# Patient Record
Sex: Female | Born: 1990 | Race: White | Hispanic: No | Marital: Single | Smoking: Former smoker
Health system: Southern US, Community
[De-identification: ages and names within clinical notes are randomized; demographics above are authoritative.]

## PROBLEM LIST (undated history)

## (undated) DIAGNOSIS — B2799 Infectious mononucleosis, unspecified with other complication: Secondary | ICD-10-CM

## (undated) DIAGNOSIS — J302 Other seasonal allergic rhinitis: Secondary | ICD-10-CM

## (undated) DIAGNOSIS — B178 Other specified acute viral hepatitis: Secondary | ICD-10-CM

## (undated) HISTORY — PX: WISDOM TOOTH EXTRACTION: SHX21

## (undated) HISTORY — DX: Other specified acute viral hepatitis: B17.8

## (undated) HISTORY — DX: Infectious mononucleosis, unspecified with other complication: B27.99

## (undated) HISTORY — DX: Other seasonal allergic rhinitis: J30.2

---

## 2012-02-24 DIAGNOSIS — B178 Other specified acute viral hepatitis: Secondary | ICD-10-CM

## 2012-02-24 HISTORY — DX: Other specified acute viral hepatitis: B17.8

## 2012-03-08 ENCOUNTER — Ambulatory Visit (INDEPENDENT_AMBULATORY_CARE_PROVIDER_SITE_OTHER): Payer: BC Managed Care – HMO | Admitting: Physician Assistant

## 2012-03-08 VITALS — BP 105/69 | HR 59 | Temp 97.9°F | Resp 16 | Ht 62.0 in | Wt 124.0 lb

## 2012-03-08 DIAGNOSIS — J069 Acute upper respiratory infection, unspecified: Secondary | ICD-10-CM

## 2012-03-08 MED ORDER — ALBUTEROL SULFATE HFA 108 (90 BASE) MCG/ACT IN AERS: 2.0000 | INHALATION_SPRAY | Freq: Four times a day (QID) | RESPIRATORY_TRACT | Status: DC | PRN

## 2012-03-08 MED ORDER — AZITHROMYCIN 250 MG PO TABS: ORAL_TABLET | ORAL | Status: DC

## 2012-03-08 MED ORDER — TOBRAMYCIN 0.3 % OP SOLN: 1.0000 [drp] | OPHTHALMIC | Status: DC

## 2012-03-08 NOTE — Patient Instructions (Addendum)
mucinex dm and sudafed.

## 2012-03-08 NOTE — Progress Notes (Signed)
  Subjective:    Patient ID: Kara Parker, female    DOB: 09/10/1990, 21 y.o.   MRN: [id number]  HPI 21 yr old CF presents with 1 week h/o cough, slight sore throat, R eye feeling full and irritated.  It is a little red.  No f/c.  Cough is worsening. OTCs not helping. Eye is not painful.   Review of Systems  All other systems reviewed and are negative.       Objective:   Physical Exam  Nursing note and vitals reviewed. Constitutional: She is oriented to person, place, and time. She appears well-developed and well-nourished.  HENT:  Head: Normocephalic and atraumatic.  Mouth/Throat: No oropharyngeal exudate.       B TM bulging with fluid.  Nasal turbinates inflamed. R side of throat with>erythema on the R than Left.  +preauricular node on R.  Eyes: EOM are normal. Pupils are equal, round, and reactive to light. Right eye exhibits no discharge. Left eye exhibits no discharge.       R conjunctiva is mildly injected and erythematous.  No active draining or chemosis.   Neck: Normal range of motion. Neck supple.  Cardiovascular: Normal rate, regular rhythm and normal heart sounds.   Pulmonary/Chest: Effort normal and breath sounds normal.       Rhonchi B on expiration at B bases and mild wheezing.  Neurological: She is alert and oriented to person, place, and time.  Skin: Skin is warm and dry.  Psychiatric: She has a normal mood and affect. Her behavior is normal.        Assessment & Plan:  URI, likely adenovirus, but with secondary bronchitis.  Hold eye drops-she will only use if she develops a true bacterial pink eye. Fluids, rest, respiratory care h/o

## 2012-03-14 ENCOUNTER — Emergency Department (HOSPITAL_COMMUNITY)
Admission: EM | Admit: 2012-03-14 | Discharge: 2012-03-15 | Disposition: A | Discharge status: Home / Self Care | Payer: BC Managed Care – PPO | Attending: Emergency Medicine | Admitting: Emergency Medicine

## 2012-03-14 DIAGNOSIS — R599 Enlarged lymph nodes, unspecified: Secondary | ICD-10-CM | POA: Insufficient documentation

## 2012-03-14 DIAGNOSIS — R591 Generalized enlarged lymph nodes: Secondary | ICD-10-CM

## 2012-03-14 DIAGNOSIS — Z880 Allergy status to penicillin: Secondary | ICD-10-CM | POA: Insufficient documentation

## 2012-03-14 DIAGNOSIS — F17200 Nicotine dependence, unspecified, uncomplicated: Secondary | ICD-10-CM | POA: Insufficient documentation

## 2012-03-14 NOTE — ED Notes (Signed)
Pt states she has been sick off and on for 4 weeks. Pt just finished abx and now she states her lymph nodes in her neck and groin are swollen. Pt c/o tightness to neck and back. Pt denies cough, sore throat at present. Pt states she has had "night sweats" last night. Pt also c/o headache.

## 2012-03-15 LAB — CBC WITH DIFFERENTIAL/PLATELET
Eosinophils Relative: 0 % (ref 0–5)
HCT: 38 % (ref 36.0–46.0)
Hemoglobin: 12.8 g/dL (ref 12.0–15.0)
Lymphocytes Relative: 20 % (ref 12–46)
Lymphs Abs: 1 10*3/uL (ref 0.7–4.0)
MCH: 30.8 pg (ref 26.0–34.0)
MCV: 91.6 fL (ref 78.0–100.0)
Monocytes Absolute: 0.3 10*3/uL (ref 0.1–1.0)
Monocytes Relative: 7 % (ref 3–12)
Platelets: 183 10*3/uL (ref 150–400)
RBC: 4.15 MIL/uL (ref 3.87–5.11)
WBC: 5 10*3/uL (ref 4.0–10.5)

## 2012-03-15 LAB — URINALYSIS, ROUTINE W REFLEX MICROSCOPIC
Glucose, UA: NEGATIVE mg/dL
Hgb urine dipstick: NEGATIVE
Ketones, ur: NEGATIVE mg/dL
Leukocytes, UA: NEGATIVE
pH: 6 (ref 5.0–8.0)

## 2012-03-15 NOTE — ED Provider Notes (Signed)
History     CSN: [id number]  Arrival date & time 03/14/12  2254   First MD Initiated Contact with Patient 03/14/12 2358      Chief Complaint  Patient presents with  . Generalized Body Aches    (Consider location/radiation/quality/duration/timing/severity/associated sxs/prior treatment) HPI Comments: 72 [address] 21 y.o. female   The chief complaint is: Patient presents with:   Generalized Body Aches   The patient has medical history significant for:   No past medical history on file.  Patient presents with complaint of generalized body aches and swollen glands. Patient states that she was recently sick for two weeks, was prescribed a z-pak but then noticed a swollen lymph node on the left side of her neck while curling her hair. Denies fever or unintentional weightloss but reports night sweats. Denies cough or SOB. Denies NVD or abdominal pain. Denies sick contacts.      The history is provided by the patient.    No past medical history on file.  No past surgical history on file.  No family history on file.  History  Substance Use Topics  . Smoking status: Current Some Day Smoker    Types: Cigarettes  . Smokeless tobacco: Not on file  . Alcohol Use: Not on file    OB History    Grav Para Term Preterm Abortions TAB SAB Ect Mult Living                  Review of Systems  Constitutional: Positive for diaphoresis. Negative for fever and chills.  HENT: Negative for congestion and sore throat.   Respiratory: Negative for cough.   Gastrointestinal: Negative for nausea, vomiting, abdominal pain and diarrhea.  Hematological: Positive for adenopathy.    Allergies  Penicillins  Home Medications  No current outpatient prescriptions on file.  BP 114/75  Pulse 88  Temp 99.2 F (37.3 C) (Oral)  Resp 16  SpO2 100%  LMP 03/02/2012  Physical Exam  Nursing note and vitals reviewed. Constitutional: She appears well-developed and well-nourished. No distress.    HENT:  Head: Normocephalic and atraumatic.  Mouth/Throat: Oropharynx is clear and moist. No oropharyngeal exudate.  Eyes: Conjunctivae normal and EOM are normal. No scleral icterus.  Neck: Normal range of motion. Neck supple.       There is superficial cervical and posterior cervical non-tender lymphadenopathy.  No supraclavicular adenopathy appreciated.  Cardiovascular: Normal rate, regular rhythm and normal heart sounds.   Pulmonary/Chest: Effort normal.  Abdominal: Soft. Bowel sounds are normal. There is no tenderness.  Genitourinary:       Patient has palpable non-tender inguinal lymphadenopathy.  Musculoskeletal:       No epitrochlear lymphadenopathy appreciated  Lymphadenopathy:    She has cervical adenopathy.  Neurological: She is alert.  Skin: Skin is warm and dry.    ED Course  Procedures (including critical care time)  Labs Reviewed - No data to display No results found. Results for orders placed during the hospital encounter of 03/14/12  CBC WITH DIFFERENTIAL      Component Value Range   WBC 5.0  4.0 - 10.5 K/uL   RBC 4.15  3.87 - 5.11 MIL/uL   Hemoglobin 12.8  12.0 - 15.0 g/dL   HCT 40.9  81.1 - 91.4 %   MCV 91.6  78.0 - 100.0 fL   MCH 30.8  26.0 - 34.0 pg   MCHC 33.7  30.0 - 36.0 g/dL   RDW 78.2  95.6 - 21.3 %  Platelets 183  150 - 400 K/uL   Neutrophils Relative 73  43 - 77 %   Neutro Abs 3.7  1.7 - 7.7 K/uL   Lymphocytes Relative 20  12 - 46 %   Lymphs Abs 1.0  0.7 - 4.0 K/uL   Monocytes Relative 7  3 - 12 %   Monocytes Absolute 0.3  0.1 - 1.0 K/uL   Eosinophils Relative 0  0 - 5 %   Eosinophils Absolute 0.0  0.0 - 0.7 K/uL   Basophils Relative 1  0 - 1 %   Basophils Absolute 0.1  0.0 - 0.1 K/uL  URINALYSIS, ROUTINE W REFLEX MICROSCOPIC      Component Value Range   Color, Urine YELLOW  YELLOW   APPearance CLEAR  CLEAR   Specific Gravity, Urine 1.015  1.005 - 1.030   pH 6.0  5.0 - 8.0   Glucose, UA NEGATIVE  NEGATIVE mg/dL   Hgb urine dipstick  NEGATIVE  NEGATIVE   Bilirubin Urine NEGATIVE  NEGATIVE   Ketones, ur NEGATIVE  NEGATIVE mg/dL   Protein, ur NEGATIVE  NEGATIVE mg/dL   Urobilinogen, UA 0.2  0.0 - 1.0 mg/dL   Nitrite NEGATIVE  NEGATIVE   Leukocytes, UA NEGATIVE  NEGATIVE  PREGNANCY, URINE      Component Value Range   Preg Test, Ur NEGATIVE  NEGATIVE     1. Lymphadenopathy       MDM  Patient presented with generalized lymphadenopathy and body aches. Labs unremarkable. Non-tender generalized lymphadenopathy concerning for possible malignancy. Patient referred to primary care with emphasis that this needs further work up. Return precautions given verbally and in discharge summary.        Pixie Casino, PA-C 03/15/12 0225

## 2012-03-16 NOTE — ED Provider Notes (Signed)
Medical screening examination/treatment/procedure(s) were performed by non-physician practitioner and as supervising physician I was immediately available for consultation/collaboration.  Donnetta Hutching, MD 03/16/12 [phone]

## 2012-03-17 ENCOUNTER — Other Ambulatory Visit: Payer: Self-pay | Admitting: Family Medicine

## 2012-03-17 ENCOUNTER — Ambulatory Visit (INDEPENDENT_AMBULATORY_CARE_PROVIDER_SITE_OTHER): Payer: BC Managed Care – HMO | Admitting: Family Medicine

## 2012-03-17 ENCOUNTER — Encounter: Payer: Self-pay | Admitting: Family Medicine

## 2012-03-17 ENCOUNTER — Ambulatory Visit: Payer: BC Managed Care – PPO | Admitting: Family

## 2012-03-17 VITALS — BP 110/62 | HR 80 | Temp 98.3°F | Ht 62.0 in | Wt 120.0 lb

## 2012-03-17 DIAGNOSIS — R509 Fever, unspecified: Secondary | ICD-10-CM

## 2012-03-17 DIAGNOSIS — F17200 Nicotine dependence, unspecified, uncomplicated: Secondary | ICD-10-CM

## 2012-03-17 DIAGNOSIS — J309 Allergic rhinitis, unspecified: Secondary | ICD-10-CM

## 2012-03-17 DIAGNOSIS — R599 Enlarged lymph nodes, unspecified: Secondary | ICD-10-CM

## 2012-03-17 DIAGNOSIS — R591 Generalized enlarged lymph nodes: Secondary | ICD-10-CM | POA: Insufficient documentation

## 2012-03-17 LAB — POCT MONO (EPSTEIN BARR VIRUS): Mono, POC: NEGATIVE

## 2012-03-17 LAB — COMPREHENSIVE METABOLIC PANEL
CO2: 26 mEq/L (ref 19–32)
Creat: 0.64 mg/dL (ref 0.50–1.10)
Glucose, Bld: 79 mg/dL (ref 70–99)
Total Bilirubin: 0.4 mg/dL (ref 0.3–1.2)

## 2012-03-17 NOTE — Progress Notes (Signed)
Chief Complaint  Patient presents with  . Hospital follow up    seen at [facility] ER this past Friday. They told her to follow up with a primary care doctor as her labs looked ok but her lymph nodes in her neck and groin area ae enlarged. Also states that she has had a HA, mainly on the right side all weekend.   HPI:  Seen in ER 9/20 with complaints of body aches and swollen glands.  She had been treated with z-pak after being sick x 2 weeks, but recently noticed swollen glands.  She was referred here to f/u due to generalized, nontender lymphadenopathy, concerning for possible malignancy, needing further work-up.  Chart/records reviewed--She was seen by a PA in 9/14 with 1 week of URI symptoms, diagnosed with URI and bronchitis.  Treated with Z-pak.  Felt better just for a few days, but then last week started with worsening headaches, body aches, and swollen glands in neck and groin.  Having headaches at R temple, and up to R side of forehead and behind R eye.  Having nasal stuffiness, postnasal drainage.  Mucus/phlegm hasn't been discolored.  Having some lowgrade subjective fevers, ongoing x3-4 days. Denies nausea, vomiting, diarrhea (just slight stomach upset with the start of school).  Denies skin rashes.  Denies sick contacts   Denies recent travel (just to [place] in early August). Denies h/o herpes or any exposure or lesions. Had unprotected sex in the past, and has been screened for HIV in the past (with adequate time after exposure before testing).. Reports having seen ticks on her body over the summer, but doesn't recall being bitten or seeing rash.  Mother wanted her checked for Lyme disease.Marland Kitchen  Past Medical History  Diagnosis Date  . Seasonal allergies    Past Surgical History  Procedure Date  . Wisdom tooth extraction    History   Social History  . Marital Status: Single    Spouse Name: N/A    Number of Children: N/A  . Years of Education: N/A   Occupational History  .  student    Social History Main Topics  . Smoking status: Current Some Day Smoker    Types: Cigarettes  . Smokeless tobacco: Never Used   Comment: social smoker while drinking  . Alcohol Use: Yes     2-4 drinks per weekend.  . Drug Use: No  . Sexually Active: Not Currently   Other Topics Concern  . 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 Hyperlipidemia Father   . Hypertension Father   . Cancer Maternal Grandmother     female type of cancer (?uterine vs ovarian, thinks uterine)  . Stroke Paternal Grandmother   . Hyperlipidemia Paternal Grandfather   . Hypertension Paternal Grandfather   . Diabetes Neg Hx    Current outpatient prescriptions:ibuprofen (ADVIL,MOTRIN) 200 MG tablet, Take 600 mg by mouth as needed., Disp: , Rfl:   Allergies  Allergen Reactions  . Penicillins Other (See Comments)    "childhood allergy"   ROS:  See HPI--+tactile fevers, headaches, congestion.  No chest pain, shortness of breath, nausea, vomiting, diarrhea, skin rash. +myalgias.  No joint pain/swelling. No vaginal discharge, odor, itch, urinary complaints.  No bleeding/bruising.  No weight changes  PHYSICAL EXAM: BP 110/62  Pulse 80  Temp 98.3 F (36.8 C) (Oral)  Ht 5\' 2"  (1.575  m)  Wt 120 lb (54.432 kg)  BMI 21.95 kg/m2  LMP 03/02/2012  Well appearing, pleasant female in no distress HEENT:  PERRL, EOMI, conjunctiva clear.  TM's and EAC's normal.  Nasal mucos mild-moderately edematous, no erythema, clear/white mucus.  OP clear, moist mucus membranes, no erythema. Neck: shotty anterior and posterior cervical lymphadenopathy, nontender.  Also with nontender inguinal lymphadenopathy bilaterally.  No axillary or supraclavicular lymphadenopathy Heart: regular rate and rhythm without murmur Lungs: clear bilaterally Abdomen: soft, nontender, no splenomegaly or  hepatomegaly Skin: no rash Extremities: no edema Psych: normal mood, affect, hygiene and grooming  Monospot negative today.  ER labs: Lab Results  Component Value Date   WBC 5.0 03/15/2012   HGB 12.8 03/15/2012   HCT 38.0 03/15/2012   MCV 91.6 03/15/2012   PLT 183 03/15/2012   Normal u/a, negative pregnancy test at ER  ASSESSMENT/PLAN: 1. Lymphadenopathy, generalized  Mono (Epstein Barr Virus), Epstein-Barr virus VCA antibody panel, B. burgdorfi antibodies, HIV antibody, Comprehensive metabolic panel, CBC with Differential  2. Fever  Mono (Epstein Barr Virus), Epstein-Barr virus VCA antibody panel, B. burgdorfi antibodies, HIV antibody, CBC with Differential  3. Allergic rhinitis, cause unspecified    4. Tobacco use disorder     Advised that given low grade fevers, recent illness, and generalized lymphadenopathy that viral etiology is suspected.  Await lab results.  If labs negative, and lymphadenopathy persists, needs CXR and further evaluation. To f/u here within 1-2 weeks; will advise of results when available.  Smoker--encouraged to quit smoking entirely

## 2012-03-18 LAB — CBC WITH DIFFERENTIAL/PLATELET
Eosinophils Relative: 0 % (ref 0–5)
HCT: 37 % (ref 36.0–46.0)
Lymphocytes Relative: 19 % (ref 12–46)
MCV: 90.7 fL (ref 78.0–100.0)
Monocytes Relative: 7 % (ref 3–12)
RBC: 4.08 MIL/uL (ref 3.87–5.11)
WBC: 4.4 10*3/uL (ref 4.0–10.5)

## 2012-03-18 LAB — EPSTEIN-BARR VIRUS VCA ANTIBODY PANEL
EBV EA IgG: <5 U/mL (ref <9.0)
EBV VCA IgM: 21.2 U/mL (ref <36.0)

## 2012-03-18 LAB — HIV ANTIBODY (ROUTINE TESTING W REFLEX): HIV: NONREACTIVE

## 2012-03-20 ENCOUNTER — Telehealth: Payer: Self-pay | Admitting: Radiology

## 2012-03-20 LAB — HEPATITIS PANEL, ACUTE
HCV Ab: NEGATIVE
Hep A IgM: NEGATIVE
Hepatitis B Surface Ag: NEGATIVE

## 2012-03-20 NOTE — Telephone Encounter (Signed)
This was discussed with you earlier, wanted to send you the chart in case you need to review further, some labs still pending.

## 2012-03-21 ENCOUNTER — Telehealth: Payer: Self-pay | Admitting: Family Medicine

## 2012-03-21 LAB — CMV ABS, IGG+IGM (CYTOMEGALOVIRUS): CMV IgM: 0.42 (ref <0.90)

## 2012-03-21 NOTE — Telephone Encounter (Signed)
Mom called and states pt dx with Mono at school.  She has brought pt home to [place] with her to care for her.  Pt is now having sharpe pain on left side of body to left side of breast.  No SOB, slept ok.  Mom wants to know if you can call her [phone].  I advised mom you were out of office and if she had not heard back from you, for her to make the decision whether to take pt to Urgent Care since weekend coming up and they are 5 hours away.

## 2012-03-21 NOTE — Telephone Encounter (Signed)
I spoke with Kara Parker, explaining all our lab tests, that clinically it looks like mono, although tests were negative.  She apparently had + monospot test at school.  Discussed natural course and risks; all questions answered.  Complaining of headache, but no other neuro complaints.  Spoke to pt at length about signs/symptoms that are more serious for which she should be rechecked.

## 2012-03-24 ENCOUNTER — Ambulatory Visit (INDEPENDENT_AMBULATORY_CARE_PROVIDER_SITE_OTHER): Payer: BC Managed Care – HMO | Admitting: Family Medicine

## 2012-03-24 ENCOUNTER — Encounter: Payer: Self-pay | Admitting: Family Medicine

## 2012-03-24 VITALS — BP 98/60 | HR 72 | Temp 98.0°F | Ht 62.0 in | Wt 119.0 lb

## 2012-03-24 DIAGNOSIS — R599 Enlarged lymph nodes, unspecified: Secondary | ICD-10-CM

## 2012-03-24 DIAGNOSIS — R51 Headache: Secondary | ICD-10-CM

## 2012-03-24 DIAGNOSIS — R591 Generalized enlarged lymph nodes: Secondary | ICD-10-CM

## 2012-03-24 DIAGNOSIS — R748 Abnormal levels of other serum enzymes: Secondary | ICD-10-CM

## 2012-03-24 LAB — HEPATIC FUNCTION PANEL
Alkaline Phosphatase: 115 U/L (ref 39–117)
Indirect Bilirubin: 0.3 mg/dL (ref 0.0–0.9)
Total Protein: 7.2 g/dL (ref 6.0–8.3)

## 2012-03-24 NOTE — Patient Instructions (Addendum)
Continue to take it easy.  No contact sports for at least another week.  You will need to gradually get back to exercise.  Fatigue can last for weeks/months.  Return if developing yellowing of skin or eyes, worsening headaches, recurrent fevers, or other concerns.    Expect lymph nodes to decrease in size over the next 2-4 weeks.  If any should remain significantly swollen or painful, return for re-evaluation

## 2012-03-24 NOTE — Progress Notes (Signed)
Chief Complaint  Patient presents with  . Follow-up    saw PA at school on 03/20/12-diagnosed with Mono. Is here for follow up.   Ongoing headache x 3 days--top of head, between eyes, "like my brain is getting squeezed through my eyeballs". Headache is better now, only slightly lingering. Overall, she is feeling better than when we spoke last Friday. Some bloody noses, and has nasal congestion. Some cough, but it made her headache feel much worse when she coughs.  Swollen glands are about the same.  Got swollen glands at back of head since last visit, and were sore (occipital).  Other glands remain unchanged, not sore, somewhat firmer. L inguinal adenopathy has improved, R is unchanged.  Had fevers over the weekend (Thurs thru Sat, none yesterday)--tactile only, didn't check.  T99.8 at nurse's last week. Had sharp pain on L side of her body last week--resolved now.  Points to her lungs/chest wall.  Denies any abdominal pain.  Taking a bunch of natural supplements per her mother--see list below, and thinks maybe those are helping her headaches.  Past Medical History  Diagnosis Date  . Seasonal allergies    Past Surgical History  Procedure Date  . Wisdom tooth extraction    History   Social History  . Marital Status: Single    Spouse Name: N/A    Number of Children: N/A  . Years of Education: N/A   Occupational History  . student    Social History Main Topics  . Smoking status: Current Some Day Smoker    Types: Cigarettes  . Smokeless tobacco: Never Used   Comment: social smoker while drinking  . Alcohol Use: Yes     2-4 drinks per weekend.  . Drug Use: No  . Sexually Active: Not Currently   Other Topics Concern  . 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 Denies shortness of breath.  See HPI  PHYSICAL EXAM: BP 98/60  Pulse 72   Temp 98 F (36.7 C) (Oral)  Ht 5\' 2"  (1.575 m)  Wt 119 lb (53.978 kg)  BMI 21.77 kg/m2  LMP 03/02/2012 Well developed, pleasant female in no distress.  No jaundice or scleral icterus.  Well-appearing. HEENT:  PERRL, EOMI, conjunctiva/sclear clear. Nasal mucosa mildly edematous, no erythema or purulence.  OP clear--normal tonsils, mucus membranes moist.  Mild tenderness to both maxillary sinuses Diffusely enlarged lymph nodes--anterior and posterior cervical chain, occipital, R>L inguinal.  No supraclavicular nodes Heart: regular rate and rhythm without murmur Lungs: clear bilaterally Skin: no rash Abdomen: no hepatosplenomegaly noted.  nontender  ASSESSMENT/PLAN: 1. Headache  CBC with Differential  2. Lymphadenopathy, generalized  CBC with Differential   suspect mono.  Our tests all negative here, but pt states had +mono test at school.  course/history is clinically consistent with mono  3. Elevated liver enzymes  Hepatic function panel   CBC and LFT's today noncontact sports okay--start gradually as tolerated.   Remain away from contact sports for at least 1-2 weeks--need to ensure that liver enzymes are back to normal, and clinically is resolving  F/u if symptoms persist, worsen, lymphadenopathy not resolving, recurrence of fevers, any abdominal pain, worsening headaches, etc. Trial of sinus rinses (and/or decongestants prn) encouraged given her sinus symptoms.

## 2012-03-25 ENCOUNTER — Encounter: Payer: Self-pay | Admitting: Family Medicine

## 2012-03-25 LAB — CBC WITH DIFFERENTIAL/PLATELET
HCT: 33.8 % — ABNORMAL LOW (ref 36.0–46.0)
Lymphocytes Relative: 74 % — ABNORMAL HIGH (ref 12–46)
MCHC: 33.4 g/dL (ref 30.0–36.0)
RDW: 13.5 % (ref 11.5–15.5)

## 2012-04-09 ENCOUNTER — Ambulatory Visit (INDEPENDENT_AMBULATORY_CARE_PROVIDER_SITE_OTHER): Payer: BC Managed Care – PPO | Admitting: Family Medicine

## 2012-04-09 ENCOUNTER — Encounter: Payer: Self-pay | Admitting: Family Medicine

## 2012-04-09 VITALS — BP 102/60 | HR 72 | Ht 62.0 in | Wt 125.0 lb

## 2012-04-09 DIAGNOSIS — B2790 Infectious mononucleosis, unspecified without complication: Secondary | ICD-10-CM

## 2012-04-09 DIAGNOSIS — R591 Generalized enlarged lymph nodes: Secondary | ICD-10-CM

## 2012-04-09 DIAGNOSIS — R599 Enlarged lymph nodes, unspecified: Secondary | ICD-10-CM

## 2012-04-09 DIAGNOSIS — R7989 Other specified abnormal findings of blood chemistry: Secondary | ICD-10-CM

## 2012-04-09 NOTE — Patient Instructions (Signed)
Now that your energy is better, feel free to resume exercise, but no contact sports.  We will call you with your test results and let you know if it is okay to resume contact sports, versus needing to wait longer.

## 2012-04-09 NOTE — Progress Notes (Signed)
Chief Complaint  Patient presents with  . Follow-up    2 week follow up. Pt declines flu vaccine.   Fatigue has resolved--this week energy is much better.  Swollen glands are getting smaller, and nontender.  Denies any abdominal pain, nausea.  Denies any sore throat.  She hasn't done any exercise, has been sleeping a lot.  Never had yellow skin or eyes.  Denies fevers.   Past Medical History  Diagnosis Date  . Seasonal allergies   . Mononucleosis, infectious, with hepatitis 02/2012   Past Surgical History  Procedure Date  . Wisdom tooth extraction    History   Social History  . Marital Status: Single    Spouse Name: N/A    Number of Children: N/A  . Years of Education: N/A   Occupational History  . student    Social History Main Topics  . Smoking status: Current Some Day Smoker    Types: Cigarettes  . Smokeless tobacco: Never Used   Comment: social smoker while drinking  . Alcohol Use: Yes     2-4 drinks per weekend.  . Drug Use: No  . Sexually Active: Not Currently   Other Topics Concern  . 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  Energy improved.  PHYSICAL EXAM: BP 102/60  Pulse 72  Ht 5\' 2"  (1.575 m)  Wt 125 lb (56.7 kg)  BMI 22.86 kg/m2  LMP 03/02/2012 Well developed, well-appearing female in no distress HEENT:  Conjunctiva anicteric.  PERRL.  OP clear Neck: shotty, nontender lymphadenopathy, <1cm size.  L posterior cervical chain node is slightly larger, but significant smaller (and nontender) compared to previously Heart: regular rate and rhythm without murmur Lungs: clear bilaterally Abdomen: soft, nontender. No organomegaly or mass Skin: no rash, no jaundice Psych: normal mood  ASSESSMENT/PLAN: 1. Elevated LFTs  Hepatic  function panel  2. Lymphadenopathy, generalized    3. Mononucleosis     Mono--gradually improving as per routine course.  Re-check LFT's today.  If inflammation resolving (LFT's normal), then can resume contact sports.  Spleen not enlarged today

## 2012-04-10 ENCOUNTER — Other Ambulatory Visit: Payer: Self-pay | Admitting: *Deleted

## 2012-04-10 DIAGNOSIS — R7989 Other specified abnormal findings of blood chemistry: Secondary | ICD-10-CM

## 2012-04-10 LAB — HEPATIC FUNCTION PANEL
ALT: 150 U/L — ABNORMAL HIGH (ref 0–35)
AST: 55 U/L — ABNORMAL HIGH (ref 0–37)
Alkaline Phosphatase: 115 U/L (ref 39–117)
Indirect Bilirubin: 0.3 mg/dL (ref 0.0–0.9)
Total Protein: 7.1 g/dL (ref 6.0–8.3)

## 2012-04-15 ENCOUNTER — Other Ambulatory Visit: Payer: BC Managed Care – PPO

## 2012-04-15 DIAGNOSIS — R7989 Other specified abnormal findings of blood chemistry: Secondary | ICD-10-CM

## 2012-04-15 LAB — HEPATIC FUNCTION PANEL
ALT: 74 U/L — ABNORMAL HIGH (ref 0–35)
Albumin: 4.1 g/dL (ref 3.5–5.2)
Bilirubin, Direct: 0.1 mg/dL (ref 0.0–0.3)
Total Protein: 7.2 g/dL (ref 6.0–8.3)

## 2012-05-31 ENCOUNTER — Ambulatory Visit (INDEPENDENT_AMBULATORY_CARE_PROVIDER_SITE_OTHER): Payer: BC Managed Care – HMO | Admitting: Family Medicine

## 2012-05-31 VITALS — BP 119/71 | HR 76 | Temp 98.4°F | Resp 16 | Ht 61.0 in | Wt 126.0 lb

## 2012-05-31 DIAGNOSIS — B349 Viral infection, unspecified: Secondary | ICD-10-CM

## 2012-05-31 DIAGNOSIS — B9789 Other viral agents as the cause of diseases classified elsewhere: Secondary | ICD-10-CM

## 2012-05-31 DIAGNOSIS — R51 Headache: Secondary | ICD-10-CM

## 2012-05-31 MED ORDER — BUTALBITAL-APAP-CAFFEINE 50-325-40 MG PO TABS: 1.0000 | ORAL_TABLET | Freq: Four times a day (QID) | ORAL | Status: DC | PRN

## 2012-05-31 NOTE — Patient Instructions (Signed)
Try to rest and get plenty of sleep  Drink lots of water  Take the Fioricet 1 or 2 tablets every 6 hours as needed for headache  May take ibuprofen along with this if needed  Return if worse.

## 2012-05-31 NOTE — Progress Notes (Signed)
Subjective

## 2012-06-02 ENCOUNTER — Telehealth: Payer: Self-pay

## 2012-06-02 NOTE — Telephone Encounter (Signed)
Patient states she can not tolerate the Fioricet. Please advise.

## 2012-06-02 NOTE — Telephone Encounter (Signed)
PT WAS GIVEN MEDICATION AT HER LAST VISIT THAT SHE IS HAVING AN ALLERGIC REACTION TO.  SAID HER SKIN GOT RED AND ITCHY THE FIRST TIME SHE TOOK IT.  THE SECOND TIME HER EYES WERE PUFFY AND ITCHY.  CAN WE CALL IN SOMETHING DIFFERENT?  [phone]

## 2012-06-03 NOTE — Telephone Encounter (Signed)
Call patient:  If she can tolerate it will give Tramadol.  Return if sx persist  Tramadol 50 #20  One q8h prn pain

## 2012-06-04 NOTE — Telephone Encounter (Signed)
LMOM to CB. 

## 2012-06-08 NOTE — Telephone Encounter (Signed)
Spoke to pt and she said that her headaches have gone away and does not need a rx for tramadol.

## 2012-08-09 ENCOUNTER — Other Ambulatory Visit: Payer: Self-pay

## 2012-10-18 ENCOUNTER — Ambulatory Visit (INDEPENDENT_AMBULATORY_CARE_PROVIDER_SITE_OTHER): Payer: BC Managed Care – HMO | Admitting: Physician Assistant

## 2012-10-18 VITALS — BP 113/70 | HR 76 | Temp 98.0°F | Resp 16 | Ht 61.5 in | Wt 126.0 lb

## 2012-10-18 DIAGNOSIS — J069 Acute upper respiratory infection, unspecified: Secondary | ICD-10-CM

## 2012-10-18 DIAGNOSIS — J309 Allergic rhinitis, unspecified: Secondary | ICD-10-CM

## 2012-10-18 MED ORDER — FLUTICASONE PROPIONATE 50 MCG/ACT NA SUSP: 2.0000 | Freq: Every day | NASAL | Status: DC

## 2012-10-18 MED ORDER — GUAIFENESIN ER 1200 MG PO TB12: 1.0000 | ORAL_TABLET | Freq: Two times a day (BID) | ORAL | Status: DC | PRN

## 2012-10-18 MED ORDER — BENZONATATE 100 MG PO CAPS: 100.0000 mg | ORAL_CAPSULE | Freq: Three times a day (TID) | ORAL | Status: DC | PRN

## 2012-10-18 MED ORDER — IPRATROPIUM BROMIDE 0.03 % NA SOLN: 2.0000 | Freq: Two times a day (BID) | NASAL | Status: DC

## 2012-10-18 MED ORDER — AZITHROMYCIN 250 MG PO TABS: ORAL_TABLET | ORAL | Status: DC

## 2012-10-18 NOTE — Patient Instructions (Signed)
Restart the Flonase nasal spray.  Using it daily can prevent you from getting more sick episodically, like you are now.  Use the atrovent nasal spray to open up your sinus passages more immediately, and stop it when your symptoms are resolved. You may fill the antibiotic if your symptoms are not improved by 10/20/2012.  You may restart it as needed.

## 2012-10-18 NOTE — Progress Notes (Signed)
  Subjective:    Patient ID: Kara Parker, female    DOB: [birth year], 21 y.o.   MRN: [id number]  HPI This 22 y.o. female presents for evaluation of cough and congestion. Symptoms began 10/15/2012 with laryngitis and sinus pressure.  Mucinex D helped some, then "moved to my chest" and "build up of fluid in my chest," and sore throat.  Coughing produces green "nasty stuff." Orinigianlly also had green nasal drainage, which has lightened up and is light yellow now, mostly just runny.  Subjective fever this am.  No nausea, vomiting, diarrhea. No unexplained myalgias, arthralgias or rash.  A couple of sore places in her mouth.  Past medical history, surgical history, family history, social history and problem list reviewed.  Review of Systems As above.    Objective:   Physical Exam Blood pressure 113/70, pulse 76, temperature 98 F (36.7 C), temperature source Oral, resp. rate 16, height 5' 1.5" (1.562 m), weight 126 lb (57.153 kg), last menstrual period 10/15/2012, SpO2 96.00%. Body mass index is 23.42 kg/(m^2). Well-developed, well nourished WF who is awake, alert and oriented, in NAD. HEENT: [place]/AT, PERRL, EOMI.  Sclera and conjunctiva are clear.  EAC are patent, TMs are normal in appearance. Nasal mucosa is pink and moist. OP is clear except for 2 small aphthous ulcers on the LEFT buccal mucousa. Neck: supple, non-tender, no lymphadenopathy, thyromegaly. Heart: RRR, no murmur Lungs: normal effort, CTA Extremities: no cyanosis, clubbing or edema. Skin: warm and dry without rash. Psychologic: good mood and appropriate affect, normal speech and behavior.     Assessment & Plan:  AR (allergic rhinitis) - Plan: fluticasone (FLONASE) 50 MCG/ACT nasal spray, ipratropium (ATROVENT) 0.03 % nasal spray  Viral URI with cough - Plan: azithromycin (ZITHROMAX) 250 MG tablet, ipratropium (ATROVENT) 0.03 % nasal spray, Guaifenesin (MUCINEX MAXIMUM STRENGTH) 1200 MG TB12, benzonatate (TESSALON) 100 MG  capsule  Patient Instructions  Restart the Flonase nasal spray.  Using it daily can prevent you from getting more sick episodically, like you are now.  Use the atrovent nasal spray to open up your sinus passages more immediately, and stop it when your symptoms are resolved. You may restart it as needed.You may fill the antibiotic if your symptoms are not improved by 10/20/2012.     Fernande Bras, PA-C Physician Assistant-Certified [facility]

## 2012-10-23 ENCOUNTER — Ambulatory Visit (INDEPENDENT_AMBULATORY_CARE_PROVIDER_SITE_OTHER): Payer: BC Managed Care – HMO | Admitting: Family Medicine

## 2012-10-23 ENCOUNTER — Ambulatory Visit: Payer: BC Managed Care – HMO

## 2012-10-23 VITALS — BP 114/72 | HR 87 | Temp 98.5°F | Resp 17 | Ht 62.5 in | Wt 124.0 lb

## 2012-10-23 DIAGNOSIS — D7289 Other specified disorders of white blood cells: Secondary | ICD-10-CM

## 2012-10-23 DIAGNOSIS — J40 Bronchitis, not specified as acute or chronic: Secondary | ICD-10-CM

## 2012-10-23 DIAGNOSIS — J029 Acute pharyngitis, unspecified: Secondary | ICD-10-CM

## 2012-10-23 DIAGNOSIS — R509 Fever, unspecified: Secondary | ICD-10-CM

## 2012-10-23 DIAGNOSIS — R05 Cough: Secondary | ICD-10-CM

## 2012-10-23 LAB — POCT CBC
HCT, POC: 42.5 % (ref 37.7–47.9)
Hemoglobin: 13.3 g/dL (ref 12.2–16.2)
Lymph, poc: 1.8 (ref 0.6–3.4)
MCH, POC: 29.8 pg (ref 27–31.2)
MCHC: 31.3 g/dL — AB (ref 31.8–35.4)
MCV: 95.1 fL (ref 80–97)
WBC: 14.4 10*3/uL — AB (ref 4.6–10.2)

## 2012-10-23 LAB — COMPREHENSIVE METABOLIC PANEL
ALT: 14 U/L (ref 0–35)
Albumin: 4 g/dL (ref 3.5–5.2)
CO2: 25 mEq/L (ref 19–32)
Glucose, Bld: 89 mg/dL (ref 70–99)
Potassium: 4.5 mEq/L (ref 3.5–5.3)
Sodium: 136 mEq/L (ref 135–145)
Total Protein: 7 g/dL (ref 6.0–8.3)

## 2012-10-23 MED ORDER — ALBUTEROL SULFATE HFA 108 (90 BASE) MCG/ACT IN AERS: 2.0000 | INHALATION_SPRAY | RESPIRATORY_TRACT | Status: DC | PRN

## 2012-10-23 MED ORDER — LEVOFLOXACIN 750 MG PO TABS: 750.0000 mg | ORAL_TABLET | Freq: Every day | ORAL | Status: DC

## 2012-10-23 MED ORDER — HYDROCODONE-HOMATROPINE 5-1.5 MG/5ML PO SYRP: ORAL_SOLUTION | ORAL | Status: DC

## 2012-10-23 NOTE — Progress Notes (Signed)
Patient ID: Kara Parker MRN: [id number], DOB: 15-Aug-1990, 21 y.o. Date of Encounter: 10/23/2012, 10:09 AM  Primary Physician: Lavonda Jumbo, MD  Chief Complaint: Recheck of URI  HPI: 22 y.o. female with history below presents with continued URI symptoms. Patient initially seen on 10/18/12 with 3 day history of cough and congestion. Treated with Flonase, Atrovent nasal, and Z pack. Symptoms briefly improved on Z pack, however 2 days ago she developed a sore throat that has continued to worsen daily. Sore throat is worse on the left side. Cough is now productive of yellow sputum and worse at nighttime. She notes some wheezing. No shortness of breath or chest pain. Subjective fever the previous evening requiring her to take ibuprofen which allowed for the fever to break. No antipyretics since. Notes increased fatigue, stating that she slept all day the previous day. No nausea, vomiting, or diarrhea. No unexplained myalgias or arthralgias. No rashes. No tick bites. She does state that she has gotten her boyfriend sick.    Past Medical History  Diagnosis Date  . Seasonal allergies   . Mononucleosis, infectious, with hepatitis 02/2012     Home Meds: Prior to Admission medications   Medication Sig Start Date End Date Taking? Authorizing Provider  benzonatate (TESSALON) 100 MG capsule Take 1-2 capsules (100-200 mg total) by mouth 3 (three) times daily as needed for cough. 10/18/12  Yes Chelle S Jeffery, PA-C  fluticasone (FLONASE) 50 MCG/ACT nasal spray Place 2 sprays into the nose daily. 10/18/12  Yes Chelle S Jeffery, PA-C  ibuprofen (ADVIL,MOTRIN) 200 MG tablet Take 600 mg by mouth as needed.   Yes Historical Provider, MD  ipratropium (ATROVENT) 0.03 % nasal spray Place 2 sprays into the nose 2 (two) times daily. 10/18/12  Yes Chelle S Jeffery, PA-C  Guaifenesin (MUCINEX MAXIMUM STRENGTH) 1200 MG TB12 Take 1 tablet (1,200 mg total) by mouth every 12 (twelve) hours as needed. 10/18/12  No Chelle Tessa Lerner,  PA-C    Allergies:  Allergies  Allergen Reactions  . Penicillins Other (See Comments)    "childhood allergy"    History   Social History  . Marital Status: Single    Spouse Name: n/a    Number of Children: 0  . Years of Education: N/A   Occupational History  . Facilities manager  .     Social History Main Topics  . Smoking status: Former Smoker    Types: Cigarettes    Quit date: 03/14/2012  . Smokeless tobacco: Never Used     Comment: social smoker while drinking  . Alcohol Use: Yes     Comment: 2-4 drinks per weekend.  . Drug Use: No  . Sexually Active: Not Currently   Other Topics Concern  . Not on file   Social History Narrative   Consulting civil engineer at [employer]; Public affairs consultant.  From [place], [place]     Review of Systems: Constitutional: positive for fever, chills, myalgias, and fatigue. negative for night sweats, or weight changes HEENT: see above Cardiovascular: negative for chest pain or palpitations Respiratory: positive for cough and wheezing. negative for hemoptysis, or shortness of breath Abdominal: negative for abdominal pain, nausea, vomiting, or diarrhea Dermatological: negative for rash Neurologic: positive for headache. negative for or syncope   Physical Exam: Blood pressure 114/72, pulse 87, temperature 98.5 F (36.9 C), temperature source Oral, resp. rate 17, height 5' 2.5" (1.588 m), weight 124 lb (56.246 kg), last menstrual period 10/15/2012, SpO2 97.00%., Body mass index  is 22.3 kg/(m^2). General: Well developed, well nourished, in no acute distress. Not toxic appearing.  Head: Normocephalic, atraumatic, eyes without discharge, sclera non-icteric, nares are congested. Bilateral auditory canals clear, TM's are without perforation, pearly grey and translucent with reflective cone of light bilaterally. Mild frontal sinus TTP. Oral cavity moist, posterior pharynx erythematous with post nasal drip. No exudate or peritonsillar  abscess. Uvula midline.  Neck: Supple. No thyromegaly. Full ROM. Lymph nodes: less than 2 cm PC bilaterally. Lungs: Coarse breath sounds without wheezes, rales, or rhonchi. Breathing is unlabored. Heart: RRR with S1 S2. No murmurs, rubs, or gallops appreciated. Abdomen: Soft, non-tender, non-distended with normoactive bowel sounds. No hepatosplenomegaly. No rebound/guarding. No obvious abdominal masses. Negative McBurney's. Msk:  Strength and tone normal for age. Extremities/Skin: Warm and dry. No clubbing or cyanosis. No edema. No rashes or suspicious lesions. Neuro: Alert and oriented X 3. Moves all extremities spontaneously. Gait is normal. CNII-XII grossly in tact. Psych:  Responds to questions appropriately with a normal affect.   Labs: Results for orders placed in visit on 10/23/12  POCT CBC      Result Value Range   WBC 14.4 (*) 4.6 - 10.2 K/uL   Lymph, poc 1.8  0.6 - 3.4   POC LYMPH PERCENT 12.5  10 - 50 %L   MID (cbc) 0.6  0 - 0.9   POC MID % 4.4  0 - 12 %M   POC Granulocyte 12.0 (*) 2 - 6.9   Granulocyte percent 83.1 (*) 37 - 80 %G   RBC 4.47  4.04 - 5.48 M/uL   Hemoglobin 13.3  12.2 - 16.2 g/dL   HCT, POC 40.9  81.1 - 47.9 %   MCV 95.1  80 - 97 fL   MCH, POC 29.8  27 - 31.2 pg   MCHC 31.3 (*) 31.8 - 35.4 g/dL   RDW, POC 91.4     Platelet Count, POC 264  142 - 424 K/uL   MPV 7.4  0 - 99.8 fL  POCT RAPID STREP A (OFFICE)      Result Value Range   Rapid Strep A Screen Negative  Negative    CMP, EBV titers, CMV titers, and throat culture all pending  CXR: [facility] reading (PRIMARY) by  Dr. Neva Seat. Mild increase in perihilar markings, otherwise negative.    ASSESSMENT AND PLAN:  22 y.o. female with bronchitis, wheezing, fever, cough, pharyngitis, and leukocytosis -Levaquin 750 mg 1 po daily #5 no RF -Proventil 2 puffs inhaled q 4-6 hours prn #1 RF 1 -Hycodan #4oz 1 tsp po q 4-6 hours prn cough no RF SED -Recheck 48 hours -Rest/fluids -Previous note  reviewed Signed, Eula Listen, PA-C 10/23/2012 10:09 AM

## 2012-10-24 LAB — CYTOMEGALOVIRUS ANTIBODY, IGG: Cytomegalovirus Ab-IgG: 5.5 U/mL — ABNORMAL HIGH (ref <0.60)

## 2012-10-24 LAB — EPSTEIN-BARR VIRUS VCA ANTIBODY PANEL
EBV VCA IgG: 230 U/mL — ABNORMAL HIGH (ref <18.0)
EBV VCA IgM: 46.4 U/mL — ABNORMAL HIGH (ref <36.0)

## 2012-10-24 NOTE — Progress Notes (Signed)
Xray read and patient discussed with Mr. Shea Evans. Agree with assessment and plan of care per her note.  Findings:  Normal heart size, mediastinal contours, and pulmonary vascularity.  Lungs hyperinflated with minimal central peribronchial thickening.  No acute infiltrate, pleural effusion or pneumothorax.  No acute osseous findings.  IMPRESSION:  Hyperinflated lungs with minimal peribronchial thickening; findings  could reflect reactive airway disease or bronchitis.  No acute infiltrate.

## 2012-10-26 LAB — CULTURE, GROUP A STREP

## 2013-04-30 ENCOUNTER — Other Ambulatory Visit: Payer: Self-pay

## 2013-10-30 IMAGING — CR DG CHEST 2V
3 series · 3 of 3 positions shown · non-contrast
Comparison: None

CLINICAL DATA: Cough, congestion, URI symptoms fever, acute
pharyngitis

CHEST - 2 VIEW

[PA (1 of 2)]
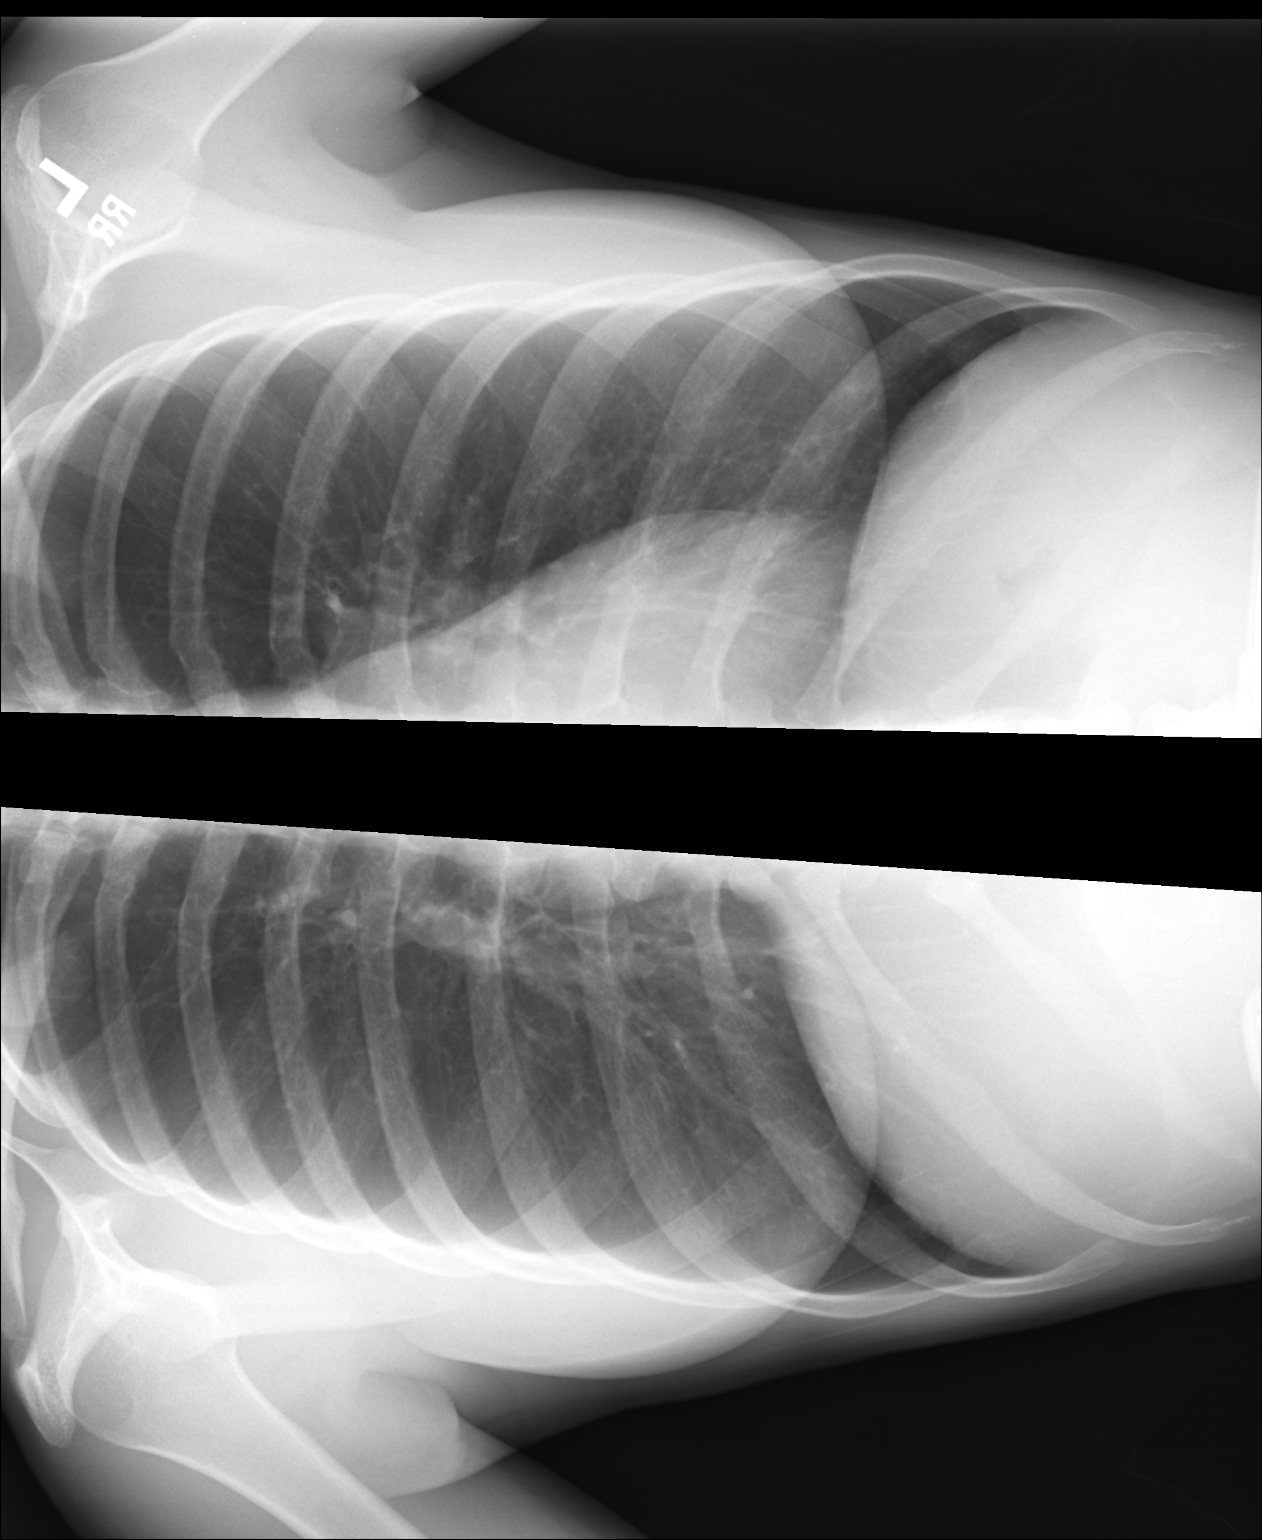

[lateral]
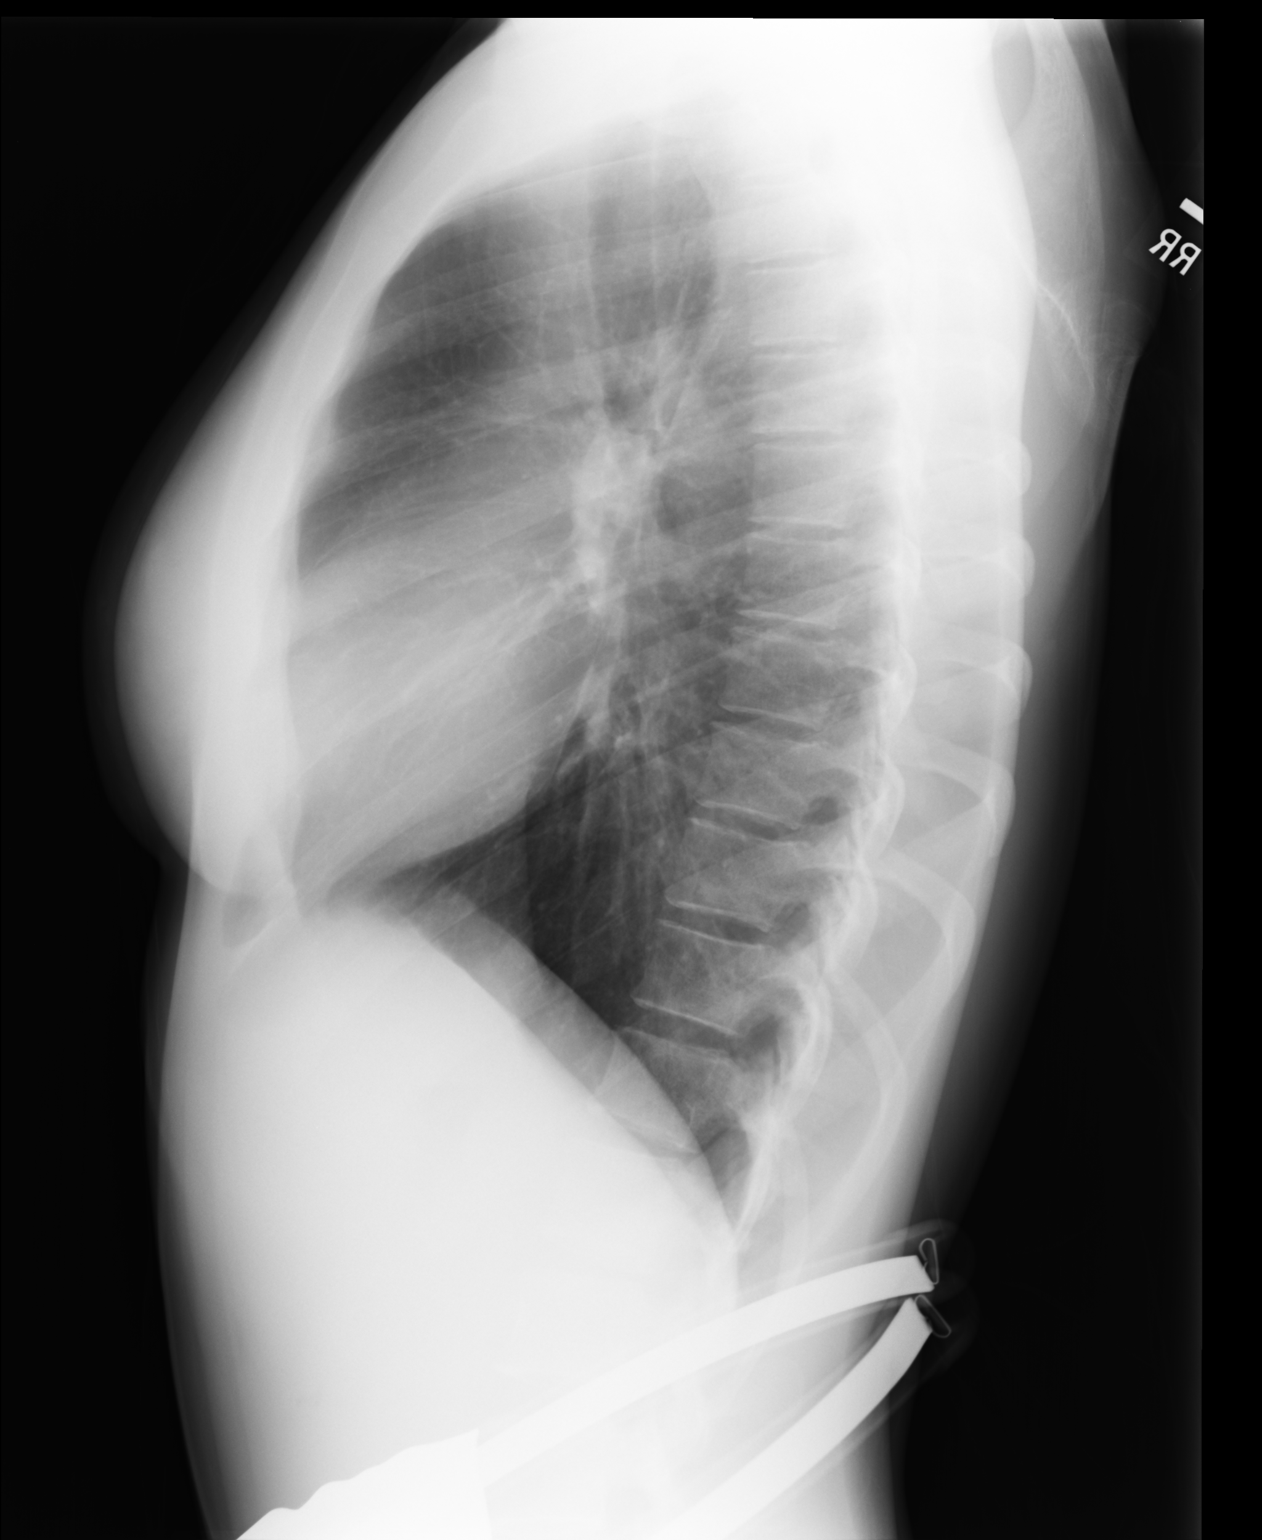

[PA (2 of 2)]
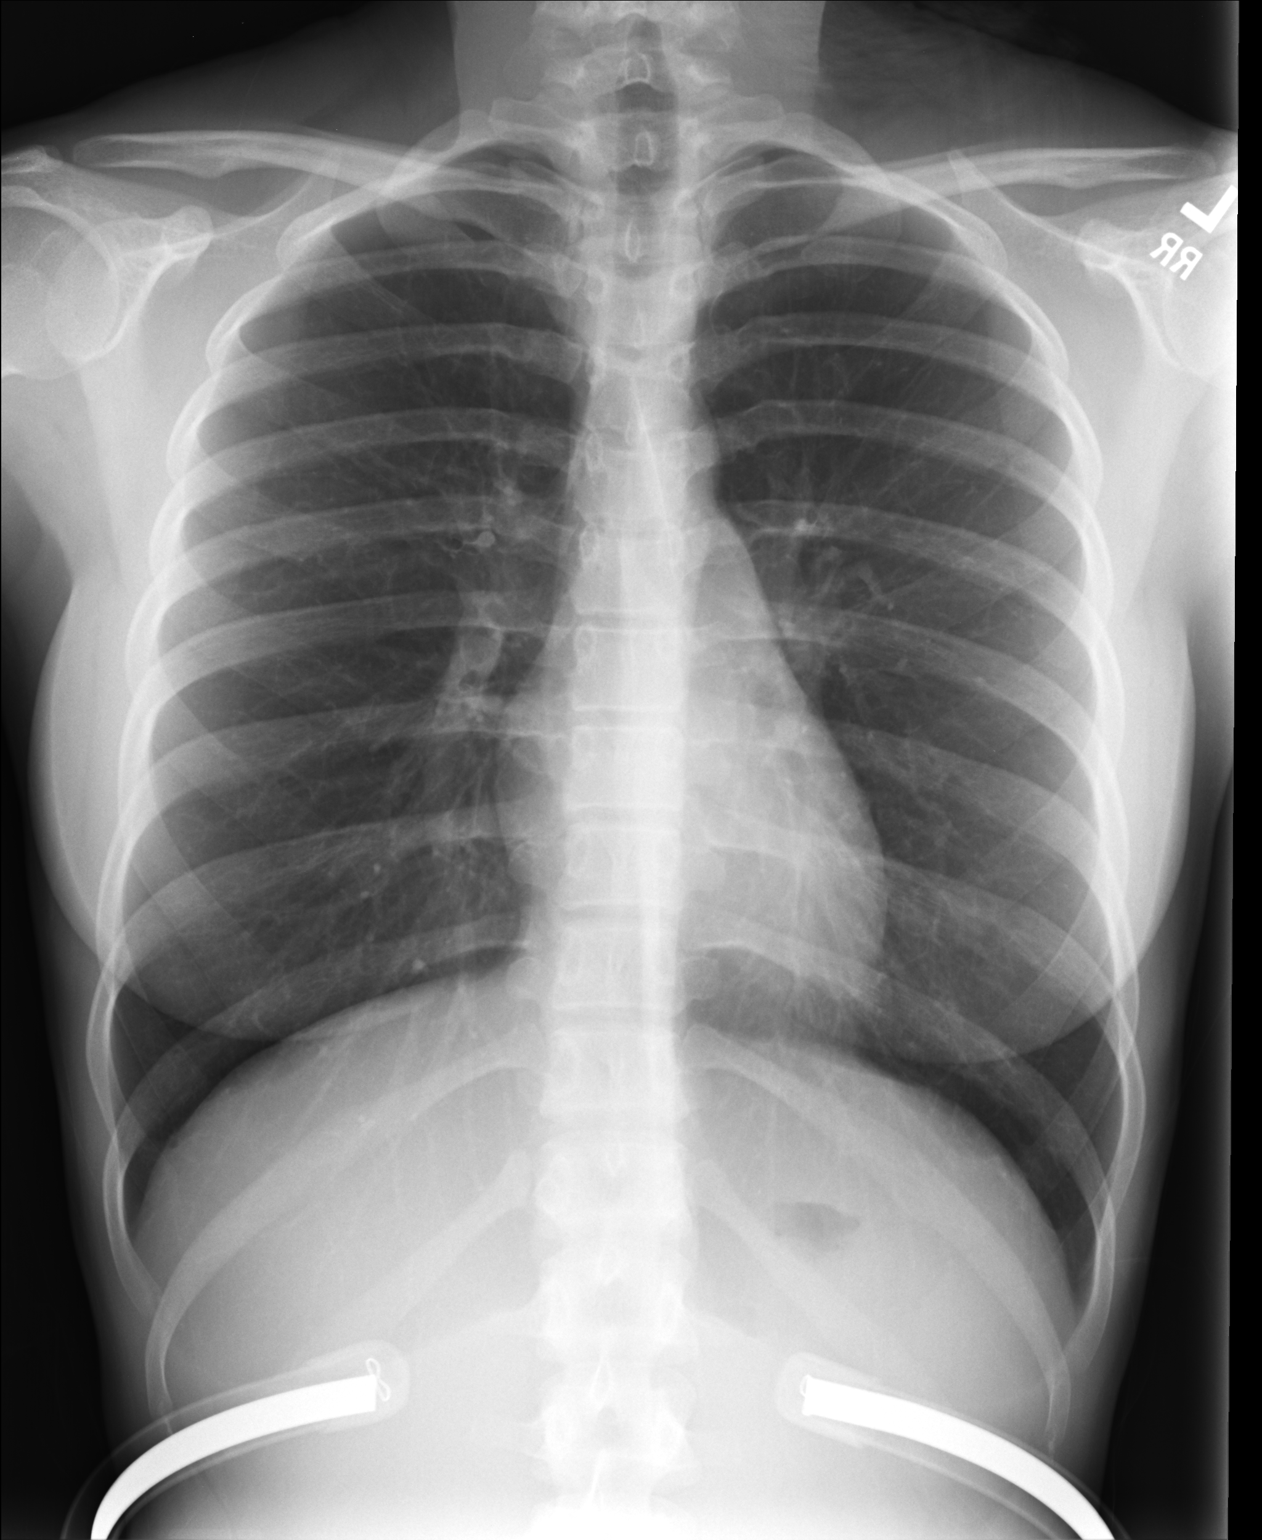

[3 of 3 positions shown; findings below may reference images not displayed]

FINDINGS: Normal heart size, mediastinal contours, and pulmonary vascularity.
Lungs hyperinflated with minimal central peribronchial thickening.
No acute infiltrate, pleural effusion or pneumothorax.
No acute osseous findings.
IMPRESSION: Hyperinflated lungs with minimal peribronchial thickening; findings
could reflect reactive airway disease or bronchitis.
No acute infiltrate.

## 2014-03-18 ENCOUNTER — Ambulatory Visit (INDEPENDENT_AMBULATORY_CARE_PROVIDER_SITE_OTHER): Payer: BC Managed Care – PPO | Admitting: Physician Assistant

## 2014-03-18 ENCOUNTER — Ambulatory Visit (INDEPENDENT_AMBULATORY_CARE_PROVIDER_SITE_OTHER): Payer: BC Managed Care – PPO

## 2014-03-18 VITALS — BP 108/64 | HR 72 | Temp 98.0°F | Resp 16 | Ht 62.5 in | Wt 125.0 lb

## 2014-03-18 DIAGNOSIS — R0602 Shortness of breath: Secondary | ICD-10-CM

## 2014-03-18 DIAGNOSIS — F419 Anxiety disorder, unspecified: Secondary | ICD-10-CM

## 2014-03-18 DIAGNOSIS — F411 Generalized anxiety disorder: Secondary | ICD-10-CM

## 2014-03-18 MED ORDER — ALPRAZOLAM 0.25 MG PO TABS: 0.2500 mg | ORAL_TABLET | Freq: Three times a day (TID) | ORAL | Status: AC | PRN

## 2014-03-18 NOTE — Progress Notes (Signed)
   Subjective:    Patient ID: Kara Parker, female    DOB: 06/27/1990, 23 y.o.   MRN: [id number]  HPI Pt presents to clinic with feeling of not being able to get a full breath of air.  She has had this for about 2 weeks but it seems to be getting worse.  She has had no injury.  She has no chest pain.  She is able to run but during her run she cannot get a full deep breath.  She feels like if she bends over she can get a fuller breath.  She will sometimes feel like this when she gets bloated but she does not feel bloated.  She has noticed that it is worse at night.  Last night when it happened she got upset and anxious and then it got worse so she just went to bed and was able to sleep.  She is not upset about anything.  She is not typically an anxious person.  She is running a triathlon in 3 days and really wants to do it but is worried that she will not be able to due to this SOB.  Review of Systems  HENT: Negative.   Respiratory: Positive for shortness of breath. Negative for cough and wheezing.   Cardiovascular: Negative for chest pain and leg swelling.       Objective:   Physical Exam  Vitals reviewed. Constitutional: She is oriented to person, place, and time. She appears well-developed and well-nourished.  HENT:  Head: Normocephalic and atraumatic.  Right Ear: External ear normal.  Left Ear: External ear normal.  Nose: Nose normal.  Mouth/Throat: Oropharynx is clear and moist.  Eyes: Conjunctivae and EOM are normal. Pupils are equal, round, and reactive to light.  Neck: Neck supple.  Cardiovascular: Normal rate, regular rhythm and normal heart sounds.   No murmur heard. Pulmonary/Chest: Effort normal and breath sounds normal. She has no wheezes.  No TTP of chest wall.  Neurological: She is alert and oriented to person, place, and time.  Skin: Skin is warm and dry.  Psychiatric: She has a normal mood and affect. Her behavior is normal. Judgment and thought content normal.   [facility]  reading (PRIMARY) by  Dr. Alwyn Ren. NAD Spirometry - WNL EKG - bradycardia, otherwise normal     Assessment & Plan:  SOB (shortness of breath) - Plan: DG Chest 2 View, EKG 12-Lead,  Anxiety-  ALPRAZolam (XANAX) 0.25 MG tablet  Pt has a clear CXR, EKG, she has a pulse Ox 100%, spirometry WNL - and no clear etiology for her SOB and with her anxiety recently related to the SOB.  We will give her Xanax to see if that helps and she will f/u with me if no better and if worse.  Benny Lennert PA-C  [facility] 03/18/2014 6:17 PM

## 2016-08-18 ENCOUNTER — Ambulatory Visit (INDEPENDENT_AMBULATORY_CARE_PROVIDER_SITE_OTHER): Payer: 59 | Admitting: Physician Assistant

## 2016-08-18 VITALS — BP 108/60 | HR 62 | Temp 98.2°F | Resp 16 | Ht 61.5 in | Wt 126.4 lb

## 2016-08-18 DIAGNOSIS — R300 Dysuria: Secondary | ICD-10-CM | POA: Diagnosis not present

## 2016-08-18 LAB — POCT URINALYSIS DIP (MANUAL ENTRY)
Bilirubin, UA: NEGATIVE
GLUCOSE UA: NEGATIVE
Ketones, POC UA: NEGATIVE
NITRITE UA: NEGATIVE
PH UA: 7.5
SPEC GRAV UA: 1.02
Urobilinogen, UA: 0.2

## 2016-08-18 LAB — POC MICROSCOPIC URINALYSIS (UMFC): Mucus: ABSENT

## 2016-08-18 MED ORDER — NITROFURANTOIN MONOHYD MACRO 100 MG PO CAPS: 100.0000 mg | ORAL_CAPSULE | Freq: Two times a day (BID) | ORAL | 0 refills | Status: AC

## 2016-08-18 NOTE — Progress Notes (Signed)
[facility] [address], [place] [address] [phone]  Date:  08/18/2016   Name:  Kara Parker   DOB:  09-Jul-1990   MRN:  [id number]  PCP:  No primary care provider on file.    History of Present Illness:  Kara Parker is a 26 y.o. female patient who presents to [facility] for dysuria.  This morning, dysuira and urgency.  The sensation had resolved, but then through the day--she is developing dysuria.  She has urgent waves but not having urination.  No hematuria.  She has some frequency.  She is trying not to hydrate because she does not want to urinate.  She is currently having menses.   Currently has IUD.  sexuallly active, with one partner.   There are no active problems to display for this patient.   Past Medical History:  Diagnosis Date  . Mononucleosis, infectious, with hepatitis 02/2012  . Seasonal allergies     Past Surgical History:  Procedure Laterality Date  . WISDOM TOOTH EXTRACTION      Social History  Substance Use Topics  . Smoking status: Former Smoker    Types: Cigarettes    Quit date: 03/14/2012  . Smokeless tobacco: Never Used     Comment: social smoker while drinking  . Alcohol use Yes     Comment: 2-4a month    Family History  Problem Relation Age of Onset  . Hyperlipidemia Father   . Hypertension Father   . Cancer Maternal Grandmother     female type of cancer (?uterine vs ovarian, thinks uterine)  . Stroke Paternal Grandmother   . Hyperlipidemia Paternal Grandfather   . Hypertension Paternal Grandfather   . Diabetes Neg Hx     Allergies  Allergen Reactions  . Penicillins Other (See Comments)    "childhood allergy"    Medication list has been reviewed and updated.  Current Outpatient Prescriptions on File Prior to Visit  Medication Sig Dispense Refill  . ALPRAZolam (XANAX) 0.25 MG tablet Take 1-2 tablets (0.25-0.5 mg total) by mouth 3 (three) times daily as needed for anxiety. (Patient not taking: Reported on  08/18/2016) 20 tablet 0   No current facility-administered medications on file prior to visit.     ROS ROS otherwise unremarkable unless listed above  Physical Examination: BP 108/60   Pulse 62   Temp 98.2 F (36.8 C) (Oral)   Resp 16   Ht 5' 1.5" (1.562 m)   Wt 126 lb 6 oz (57.3 kg)   LMP 08/14/2016   SpO2 100%   BMI 23.49 kg/m  Ideal Body Weight: Weight in (lb) to have BMI = 25: 134.2  Physical Exam  Constitutional: She is oriented to person, place, and time. She appears well-developed and well-nourished. No distress.  HENT:  Head: Normocephalic and atraumatic.  Right Ear: External ear normal.  Left Ear: External ear normal.  Eyes: Conjunctivae and EOM are normal. Pupils are equal, round, and reactive to light.  Cardiovascular: Normal rate.   Pulmonary/Chest: Effort normal. No respiratory distress.  Abdominal: Soft. Normal appearance. There is tenderness in the suprapubic area. There is no CVA tenderness.  Neurological: She is alert and oriented to person, place, and time.  Skin: She is not diaphoretic.  Psychiatric: She has a normal mood and affect. Her behavior is normal.   Results for orders placed or performed in visit on 08/18/16  POCT urinalysis dipstick  Result Value Ref Range   Color, UA yellow yellow  Clarity, UA clear clear   Glucose, UA negative negative   Bilirubin, UA negative negative   Ketones, POC UA negative negative   Spec Grav, UA 1.020    Blood, UA moderate (A) negative   pH, UA 7.5    Protein Ur, POC =30 (A) negative   Urobilinogen, UA 0.2    Nitrite, UA Negative Negative   Leukocytes, UA large (3+) (A) Negative  POCT Microscopic Urinalysis ([facility])  Result Value Ref Range   WBC,UR,HPF,POC Moderate (A) None WBC/hpf   RBC,UR,HPF,POC Few (A) None RBC/hpf   Bacteria Many (A) None, Too numerous to count   Mucus Absent Absent   Epithelial Cells, UR Per Microscopy Moderate (A) None, Too numerous to count cells/hpf     Assessment and  Plan: Kara Parker is a 26 y.o. female who is here today for cc of dysuria.  Dysuria - Plan: POCT urinalysis dipstick, POCT Microscopic Urinalysis ([facility]), nitrofurantoin, macrocrystal-monohydrate, (MACROBID) 100 MG capsule  Kara PlattStephanie Abiola Behring, PA-C [facility] 2/25/20189:14 PM

## 2016-08-18 NOTE — Patient Instructions (Addendum)
Please hydrate well with 64 oz of water if not more.   Take ibuprofen or tylenol for pain or fever.  Urinary Tract Infection, Adult Introduction A urinary tract infection (UTI) is an infection of any part of the urinary tract. The urinary tract includes the:  Kidneys.  Ureters.  Bladder.  Urethra. These organs make, store, and get rid of pee (urine) in the body. Follow these instructions at home:  Take over-the-counter and prescription medicines only as told by your doctor.  If you were prescribed an antibiotic medicine, take it as told by your doctor. Do not stop taking the antibiotic even if you start to feel better.  Avoid the following drinks:  Alcohol.  Caffeine.  Tea.  Carbonated drinks.  Drink enough fluid to keep your pee clear or pale yellow.  Keep all follow-up visits as told by your doctor. This is important.  Make sure to:  Empty your bladder often and completely. Do not to hold pee for long periods of time.  Empty your bladder before and after sex.  Wipe from front to back after a bowel movement if you are female. Use each tissue one time when you wipe. Contact a doctor if:  You have back pain.  You have a fever.  You feel sick to your stomach (nauseous).  You throw up (vomit).  Your symptoms do not get better after 3 days.  Your symptoms go away and then come back. Get help right away if:  You have very bad back pain.  You have very bad lower belly (abdominal) pain.  You are throwing up and cannot keep down any medicines or water. This information is not intended to replace advice given to you by your health care provider. Make sure you discuss any questions you have with your health care provider. Document Released: 11/28/2007 Document Revised: 11/17/2015 Document Reviewed: 05/02/2015  2017 Elsevier     IF you received an x-ray today, you will receive an invoice from [facility]. Please contact [facility] at  [phone] with questions or concerns regarding your invoice.   IF you received labwork today, you will receive an invoice from [facility]. Please contact [facility] at [phone] with questions or concerns regarding your invoice.   Our billing staff will not be able to assist you with questions regarding bills from these companies.  You will be contacted with the lab results as soon as they are available. The fastest way to get your results is to activate your My Chart account. Instructions are located on the last page of this paperwork. If you have not heard from us regarding the results in 2 weeks, please contact this office.

## 2022-11-28 ENCOUNTER — Other Ambulatory Visit: Payer: Self-pay
